# Patient Record
Sex: Female | Born: 1980 | Hispanic: No | Marital: Married | Smoking: Never smoker
Health system: Southern US, Community
[De-identification: ages and names within clinical notes are randomized; demographics above are authoritative.]

## PROBLEM LIST (undated history)

## (undated) DIAGNOSIS — Z789 Other specified health status: Secondary | ICD-10-CM

## (undated) HISTORY — PX: NO PAST SURGERIES: SHX2092

---

## 2006-09-28 ENCOUNTER — Other Ambulatory Visit: Admission: RE | Admit: 2006-09-28 | Discharge: 2006-09-28 | Payer: Self-pay | Admitting: Family Medicine

## 2008-01-14 ENCOUNTER — Other Ambulatory Visit: Admission: RE | Admit: 2008-01-14 | Discharge: 2008-01-14 | Payer: Self-pay | Admitting: Obstetrics and Gynecology

## 2008-08-27 ENCOUNTER — Inpatient Hospital Stay (HOSPITAL_COMMUNITY): Admission: AD | Admit: 2008-08-27 | Discharge: 2008-08-30 | Payer: Self-pay | Admitting: Obstetrics and Gynecology

## 2008-08-28 ENCOUNTER — Encounter (INDEPENDENT_AMBULATORY_CARE_PROVIDER_SITE_OTHER): Payer: Self-pay | Admitting: Obstetrics and Gynecology

## 2009-01-22 ENCOUNTER — Other Ambulatory Visit: Admission: RE | Admit: 2009-01-22 | Discharge: 2009-01-22 | Payer: Self-pay | Admitting: Obstetrics and Gynecology

## 2010-01-24 ENCOUNTER — Other Ambulatory Visit
Admission: RE | Admit: 2010-01-24 | Discharge: 2010-01-24 | Payer: Self-pay | Source: Home / Self Care | Admitting: Obstetrics and Gynecology

## 2010-07-12 LAB — CBC
HCT: 28.9 % — ABNORMAL LOW (ref 36.0–46.0)
HCT: 38.5 % (ref 36.0–46.0)
Hemoglobin: 10.3 g/dL — ABNORMAL LOW (ref 12.0–15.0)
Hemoglobin: 13.4 g/dL (ref 12.0–15.0)
MCHC: 35.6 g/dL (ref 30.0–36.0)
MCV: 88.1 fL (ref 78.0–100.0)
RBC: 3.28 MIL/uL — ABNORMAL LOW (ref 3.87–5.11)
RDW: 12.9 % (ref 11.5–15.5)
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
## 2011-01-25 ENCOUNTER — Other Ambulatory Visit: Payer: Self-pay | Admitting: Obstetrics and Gynecology

## 2011-01-25 ENCOUNTER — Other Ambulatory Visit (HOSPITAL_COMMUNITY)
Admission: RE | Admit: 2011-01-25 | Discharge: 2011-01-25 | Disposition: A | Discharge status: Home / Self Care | Payer: PRIVATE HEALTH INSURANCE | Source: Ambulatory Visit | Attending: Obstetrics and Gynecology | Admitting: Obstetrics and Gynecology

## 2011-01-25 DIAGNOSIS — Z01419 Encounter for gynecological examination (general) (routine) without abnormal findings: Secondary | ICD-10-CM | POA: Insufficient documentation

## 2011-08-21 ENCOUNTER — Inpatient Hospital Stay (HOSPITAL_COMMUNITY): Payer: No Typology Code available for payment source

## 2011-08-21 ENCOUNTER — Inpatient Hospital Stay (HOSPITAL_COMMUNITY)
Admission: AD | Admit: 2011-08-21 | Discharge: 2011-08-21 | Disposition: A | Discharge status: Home / Self Care | Payer: No Typology Code available for payment source | Source: Ambulatory Visit | Attending: Obstetrics and Gynecology | Admitting: Obstetrics and Gynecology

## 2011-08-21 ENCOUNTER — Encounter (HOSPITAL_COMMUNITY): Payer: Self-pay | Admitting: *Deleted

## 2011-08-21 DIAGNOSIS — O99891 Other specified diseases and conditions complicating pregnancy: Secondary | ICD-10-CM | POA: Insufficient documentation

## 2011-08-21 DIAGNOSIS — R109 Unspecified abdominal pain: Secondary | ICD-10-CM | POA: Insufficient documentation

## 2011-08-21 HISTORY — DX: Other specified health status: Z78.9

## 2011-08-21 LAB — CBC
HCT: 36.3 % (ref 36.0–46.0)
Hemoglobin: 12.2 g/dL (ref 12.0–15.0)
MCH: 27.9 pg (ref 26.0–34.0)
MCV: 82.9 fL (ref 78.0–100.0)
RBC: 4.38 MIL/uL (ref 3.87–5.11)

## 2011-08-21 LAB — POCT PREGNANCY, URINE: Preg Test, Ur: POSITIVE — AB
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
## 2011-09-01 LAB — OB RESULTS CONSOLE HIV ANTIBODY (ROUTINE TESTING): HIV: NONREACTIVE

## 2011-09-01 LAB — OB RESULTS CONSOLE RUBELLA ANTIBODY, IGM: Rubella: IMMUNE

## 2011-09-01 LAB — OB RESULTS CONSOLE RPR: RPR: NONREACTIVE

## 2011-09-01 LAB — OB RESULTS CONSOLE HEPATITIS B SURFACE ANTIGEN: Hepatitis B Surface Ag: NEGATIVE

## 2011-09-25 LAB — OB RESULTS CONSOLE GC/CHLAMYDIA: Gonorrhea: NEGATIVE

## 2012-04-21 ENCOUNTER — Encounter (HOSPITAL_COMMUNITY): Payer: Self-pay | Admitting: *Deleted

## 2012-04-21 ENCOUNTER — Inpatient Hospital Stay (HOSPITAL_COMMUNITY): Payer: PRIVATE HEALTH INSURANCE | Admitting: Anesthesiology

## 2012-04-21 ENCOUNTER — Inpatient Hospital Stay (HOSPITAL_COMMUNITY)
Admission: AD | Admit: 2012-04-21 | Discharge: 2012-04-22 | DRG: 775 | Disposition: A | Discharge status: Home / Self Care | Payer: PRIVATE HEALTH INSURANCE | Source: Ambulatory Visit | Attending: Obstetrics and Gynecology | Admitting: Obstetrics and Gynecology

## 2012-04-21 ENCOUNTER — Encounter (HOSPITAL_COMMUNITY): Payer: Self-pay | Admitting: Anesthesiology

## 2012-04-21 LAB — CBC
HCT: 39.6 % (ref 36.0–46.0)
Hemoglobin: 14.1 g/dL (ref 12.0–15.0)
MCH: 29.6 pg (ref 26.0–34.0)
MCHC: 35.6 g/dL (ref 30.0–36.0)
RBC: 4.76 MIL/uL (ref 3.87–5.11)

## 2012-04-21 LAB — TYPE AND SCREEN
ABO/RH(D): B POS
Antibody Screen: NEGATIVE

## 2012-04-21 MED ORDER — LACTATED RINGERS IV SOLN: INTRAVENOUS | Status: DC

## 2012-04-21 MED ORDER — OXYCODONE-ACETAMINOPHEN 5-325 MG PO TABS: 1.0000 | ORAL_TABLET | ORAL | Status: DC | PRN

## 2012-04-21 MED ORDER — WITCH HAZEL-GLYCERIN EX PADS: 1.0000 "application " | MEDICATED_PAD | CUTANEOUS | Status: DC | PRN

## 2012-04-21 MED ORDER — OXYCODONE-ACETAMINOPHEN 5-325 MG PO TABS
1.0000 | ORAL_TABLET | ORAL | Status: DC | PRN
  Administered 2012-04-22: 1 via ORAL
  Filled 2012-04-21: qty 1

## 2012-04-21 MED ORDER — IBUPROFEN 600 MG PO TABS: 600.0000 mg | ORAL_TABLET | Freq: Four times a day (QID) | ORAL | Status: DC | PRN

## 2012-04-21 MED ORDER — TETANUS-DIPHTH-ACELL PERTUSSIS 5-2.5-18.5 LF-MCG/0.5 IM SUSP
0.5000 mL | Freq: Once | INTRAMUSCULAR | Status: AC
  Administered 2012-04-22: 0.5 mL via INTRAMUSCULAR
  Filled 2012-04-21: qty 0.5

## 2012-04-21 MED ORDER — MEASLES, MUMPS & RUBELLA VAC ~~LOC~~ INJ: 0.5000 mL | INJECTION | Freq: Once | SUBCUTANEOUS | Status: DC

## 2012-04-21 MED ORDER — MEDROXYPROGESTERONE ACETATE 150 MG/ML IM SUSP: 150.0000 mg | INTRAMUSCULAR | Status: DC | PRN

## 2012-04-21 MED ORDER — PRENATAL MULTIVITAMIN CH
1.0000 | ORAL_TABLET | Freq: Every day | ORAL | Status: DC
  Administered 2012-04-22: 1 via ORAL
  Filled 2012-04-21: qty 1

## 2012-04-21 MED ORDER — FLEET ENEMA 7-19 GM/118ML RE ENEM: 1.0000 | ENEMA | Freq: Once | RECTAL | Status: DC

## 2012-04-21 MED ORDER — FENTANYL 2.5 MCG/ML BUPIVACAINE 1/10 % EPIDURAL INFUSION (WH - ANES)
14.0000 mL/h | INTRAMUSCULAR | Status: DC
  Filled 2012-04-21: qty 125

## 2012-04-21 MED ORDER — FENTANYL 2.5 MCG/ML BUPIVACAINE 1/10 % EPIDURAL INFUSION (WH - ANES)
INTRAMUSCULAR | Status: DC | PRN
  Administered 2012-04-21: 14 mL/h via EPIDURAL

## 2012-04-21 MED ORDER — LIDOCAINE HCL (PF) 1 % IJ SOLN
30.0000 mL | INTRAMUSCULAR | Status: DC | PRN
  Filled 2012-04-21: qty 30

## 2012-04-21 MED ORDER — EPHEDRINE 5 MG/ML INJ: 10.0000 mg | INTRAVENOUS | Status: DC | PRN

## 2012-04-21 MED ORDER — EPHEDRINE 5 MG/ML INJ
10.0000 mg | INTRAVENOUS | Status: DC | PRN
  Filled 2012-04-21: qty 4

## 2012-04-21 MED ORDER — IBUPROFEN 600 MG PO TABS
600.0000 mg | ORAL_TABLET | Freq: Four times a day (QID) | ORAL | Status: DC
  Administered 2012-04-21 – 2012-04-22 (×4): 600 mg via ORAL
  Filled 2012-04-21 (×4): qty 1

## 2012-04-21 MED ORDER — ONDANSETRON HCL 4 MG/2ML IJ SOLN: 4.0000 mg | Freq: Four times a day (QID) | INTRAMUSCULAR | Status: DC | PRN

## 2012-04-21 MED ORDER — DIPHENHYDRAMINE HCL 50 MG/ML IJ SOLN: 12.5000 mg | INTRAMUSCULAR | Status: DC | PRN

## 2012-04-21 MED ORDER — ZOLPIDEM TARTRATE 5 MG PO TABS: 5.0000 mg | ORAL_TABLET | Freq: Every evening | ORAL | Status: DC | PRN

## 2012-04-21 MED ORDER — LANOLIN HYDROUS EX OINT: TOPICAL_OINTMENT | CUTANEOUS | Status: DC | PRN

## 2012-04-21 MED ORDER — ONDANSETRON HCL 4 MG PO TABS: 4.0000 mg | ORAL_TABLET | ORAL | Status: DC | PRN

## 2012-04-21 MED ORDER — LACTATED RINGERS IV SOLN
500.0000 mL | Freq: Once | INTRAVENOUS | Status: AC
  Administered 2012-04-21: 500 mL via INTRAVENOUS

## 2012-04-21 MED ORDER — DIBUCAINE 1 % RE OINT
1.0000 "application " | TOPICAL_OINTMENT | RECTAL | Status: DC | PRN
  Filled 2012-04-21: qty 28

## 2012-04-21 MED ORDER — DIPHENHYDRAMINE HCL 25 MG PO CAPS: 25.0000 mg | ORAL_CAPSULE | Freq: Four times a day (QID) | ORAL | Status: DC | PRN

## 2012-04-21 MED ORDER — OXYTOCIN 40 UNITS IN LACTATED RINGERS INFUSION - SIMPLE MED
62.5000 mL/h | INTRAVENOUS | Status: DC
  Filled 2012-04-21: qty 1000

## 2012-04-21 MED ORDER — LACTATED RINGERS IV SOLN: 500.0000 mL | INTRAVENOUS | Status: DC | PRN

## 2012-04-21 MED ORDER — BUTORPHANOL TARTRATE 1 MG/ML IJ SOLN: 1.0000 mg | INTRAMUSCULAR | Status: DC | PRN

## 2012-04-21 MED ORDER — PHENYLEPHRINE 40 MCG/ML (10ML) SYRINGE FOR IV PUSH (FOR BLOOD PRESSURE SUPPORT): 80.0000 ug | PREFILLED_SYRINGE | INTRAVENOUS | Status: DC | PRN

## 2012-04-21 MED ORDER — CITRIC ACID-SODIUM CITRATE 334-500 MG/5ML PO SOLN: 30.0000 mL | ORAL | Status: DC | PRN

## 2012-04-21 MED ORDER — OXYTOCIN BOLUS FROM INFUSION
500.0000 mL | INTRAVENOUS | Status: DC
  Administered 2012-04-21: 500 mL via INTRAVENOUS

## 2012-04-21 MED ORDER — SIMETHICONE 80 MG PO CHEW: 80.0000 mg | CHEWABLE_TABLET | ORAL | Status: DC | PRN

## 2012-04-21 MED ORDER — BENZOCAINE-MENTHOL 20-0.5 % EX AERO
1.0000 "application " | INHALATION_SPRAY | CUTANEOUS | Status: DC | PRN
  Filled 2012-04-21: qty 56

## 2012-04-21 MED ORDER — ONDANSETRON HCL 4 MG/2ML IJ SOLN: 4.0000 mg | INTRAMUSCULAR | Status: DC | PRN

## 2012-04-21 MED ORDER — ACETAMINOPHEN 325 MG PO TABS: 650.0000 mg | ORAL_TABLET | ORAL | Status: DC | PRN

## 2012-04-21 MED ORDER — PHENYLEPHRINE 40 MCG/ML (10ML) SYRINGE FOR IV PUSH (FOR BLOOD PRESSURE SUPPORT)
80.0000 ug | PREFILLED_SYRINGE | INTRAVENOUS | Status: DC | PRN
  Filled 2012-04-21: qty 5

## 2012-04-21 MED ORDER — LIDOCAINE HCL (PF) 1 % IJ SOLN
INTRAMUSCULAR | Status: DC | PRN
  Administered 2012-04-21 (×2): 9 mL

## 2012-04-21 MED ORDER — DEXTROSE IN LACTATED RINGERS 5 % IV SOLN: INTRAVENOUS | Status: DC

## 2012-04-21 MED ORDER — SENNOSIDES-DOCUSATE SODIUM 8.6-50 MG PO TABS: 2.0000 | ORAL_TABLET | Freq: Every day | ORAL | Status: DC

## 2012-04-21 NOTE — Progress Notes (Signed)
Delivery Note At 10:57 AM a viable and healthy female was delivered via Vaginal, Spontaneous Delivery (Presentation: ; Occiput Anterior).  APGAR: 9, 9; weight .   Placenta status: Intact, Spontaneous.  Cord: 3 vessels with the following complications: None.    Anesthesia: Epidural  Episiotomy: None Lacerations: None Suture Repair: none Est. Blood Loss (mL): 450  Mom to postpartum.  Baby to nursery-stable.  Leeam Cedrone E 04/21/2012, 11:18 AM

## 2012-04-21 NOTE — Anesthesia Preprocedure Evaluation (Signed)

## 2012-04-21 NOTE — Progress Notes (Signed)
Dr. Neva Seat notified patient arrived with complaints of contractions every 3-5 min and some brown discharge. Contracting every 2-4 on the monitor. Fetal tracing reassuring at this time with moderate variability 10x10 accels, with a few variable decels. SVE 2/60/-3 which is a change from last exam per patient. She was closed on Friday. Will continue to monitor and recheck in 2 hours.

## 2012-04-21 NOTE — MAU Note (Signed)
Contractions every 3-5 mins. Patient noticed some brown discharge.

## 2012-04-21 NOTE — Anesthesia Procedure Notes (Signed)
Epidural Patient location during procedure: OB Start time: 04/21/2012 9:52 AM End time: 04/21/2012 9:56 AM  Staffing Anesthesiologist: Sandrea Hughs Performed by: anesthesiologist   Preanesthetic Checklist Completed: patient identified, site marked, surgical consent, pre-op evaluation, timeout performed, IV checked, risks and benefits discussed and monitors and equipment checked  Epidural Patient position: sitting Prep: site prepped and draped and DuraPrep Patient monitoring: continuous pulse ox and blood pressure Approach: midline Injection technique: LOR air  Needle:  Needle type: Tuohy  Needle gauge: 17 G Needle length: 9 cm and 9 Needle insertion depth: 5 cm cm Catheter type: closed end flexible Catheter size: 19 Gauge Catheter at skin depth: 10 cm Test dose: negative and Other  Assessment Sensory level: T10 Events: blood not aspirated, injection not painful, no injection resistance, negative IV test and no paresthesia  Additional Notes Reason for block:procedure for pain

## 2012-04-21 NOTE — Progress Notes (Signed)
Dr Neva Seat updated on pt's progress, orders received to admit pt.

## 2012-04-21 NOTE — H&P (Signed)
Makayla Foster is a 32 y.o. female presenting for labor at 48 1/7 weeks.  Prenatal course uncomplicated.    OB History    Grav Para Term Preterm Abortions TAB SAB Ect Mult Living   2 1 1       1      Past Medical History  Diagnosis Date  . No pertinent past medical history    Past Surgical History  Procedure Date  . No past surgeries    Family History: family history is negative for Anesthesia problems, and Hypotension, and Malignant hyperthermia, and Pseudochol deficiency, . Social History:  reports that she has never smoked. She does not have any smokeless tobacco history on file. She reports that she does not drink alcohol or use illicit drugs.  ROS Not contributory  Physical exam:  General: well developed, no distress HEENT nl Chest clear Heart S1 and S2 clear Abd BS present Term gravida Ext nl Cx 3.5 cm on admission  Assessment:  Term labor  Plan:  Expectant care   Dilation: 9 Effacement (%): 90 Station: 0 Exam by:: Nikita Surman Blood pressure 89/64, pulse 95, temperature 97.8 F (36.6 C), temperature source Oral, resp. rate 20, height 4' 11.75" (1.518 m), weight 72.394 kg (159 lb 9.6 oz), last menstrual period 06/29/2011, SpO2 100.00%.   Prenatal labs: ABO, Rh: --/--/B POS (05/20 1455) Antibody: Negative (05/31 0000) Rubella:   RPR: Nonreactive (05/31 0000)  HBsAg: Negative (05/31 0000)  HIV: Non-reactive (05/31 0000)  GBS: Negative (12/20 0000)    Lavonn Maxcy E 04/21/2012, 11:10 AM

## 2012-04-22 LAB — CBC
MCH: 29.4 pg (ref 26.0–34.0)
MCV: 86.4 fL (ref 78.0–100.0)
Platelets: 182 10*3/uL (ref 150–400)
RDW: 13.4 % (ref 11.5–15.5)
WBC: 12.9 10*3/uL — ABNORMAL HIGH (ref 4.0–10.5)

## 2012-04-22 MED ORDER — IBUPROFEN 600 MG PO TABS: 600.0000 mg | ORAL_TABLET | Freq: Four times a day (QID) | ORAL | Status: AC | PRN

## 2012-04-22 MED ORDER — PRENATAL MULTIVITAMIN CH: 1.0000 | ORAL_TABLET | Freq: Every day | ORAL | Status: AC

## 2012-04-22 NOTE — Progress Notes (Signed)
Post Partum Day 1 s/p vaginal delivery  Subjective: no complaints, up ad lib, voiding and tolerating PO patient request early discharge home   Objective: Blood pressure 90/56, pulse 81, temperature 98 F (36.7 C), temperature source Oral, resp. rate 18, height 4' 11.75" (1.518 m), weight 72.394 kg (159 lb 9.6 oz), last menstrual period 06/29/2011, SpO2 100.00%, unknown if currently breastfeeding.  Physical Exam:  General: alert and cooperative Lochia: appropriate Uterine Fundus: firm Incision: NA DVT Evaluation: No evidence of DVT seen on physical exam.   Basename 04/22/12 0540 04/21/12 0850  HGB 11.5* 14.1  HCT 33.8* 39.6    Assessment/Plan: Discharge home   LOS: 1 day   Eastin Swing J. 04/22/2012, 10:22 AM

## 2012-04-22 NOTE — Discharge Summary (Signed)
Obstetric Discharge Summary Reason for Admission: onset of labor Prenatal Procedures: none Intrapartum Procedures: spontaneous vaginal delivery Postpartum Procedures: none Complications-Operative and Postpartum: none Hemoglobin  Date Value Range Status  04/22/2012 11.5* 12.0 - 15.0 g/dL Final     DELTA CHECK NOTED     REPEATED TO VERIFY     HCT  Date Value Range Status  04/22/2012 33.8* 36.0 - 46.0 % Final    Physical Exam:  General: alert and cooperative Lochia: appropriate Uterine Fundus: firm Incision: NA DVT Evaluation: No evidence of DVT seen on physical exam.  Discharge Diagnoses: Term Pregnancy-delivered  Discharge Information: Date: 04/22/2012 Activity: pelvic rest Diet: routine Medications: PNV and Ibuprofen Condition: stable Instructions: refer to practice specific booklet Discharge to: home Follow-up Information    Follow up with Jessee Avers., MD. Schedule an appointment as soon as possible for a visit in 6 weeks. (post partum visit )    Contact information:   [address] [place] [address] [phone]          Newborn Data: Live born female  Birth Weight: 5 lb 13 oz (2635 g) APGAR: 9, 9  Home with mother.  Makayla Eddinger J. 04/22/2012, 10:26 AM

## 2012-04-22 NOTE — Anesthesia Postprocedure Evaluation (Signed)
  Anesthesia Post-op Note  Patient: Makayla Foster  Procedure(s) Performed: * No procedures listed *  Patient Location: Mother/Baby  Anesthesia Type:Epidural  Level of Consciousness: awake  Airway and Oxygen Therapy: Patient Spontanous Breathing  Post-op Pain: mild  Post-op Assessment: Patient's Cardiovascular Status Stable and Respiratory Function Stable  Post-op Vital Signs: stable  Complications: No apparent anesthesia complications

## 2012-04-24 ENCOUNTER — Inpatient Hospital Stay (HOSPITAL_COMMUNITY): Admission: RE | Admit: 2012-04-24 | Payer: PRIVATE HEALTH INSURANCE | Source: Ambulatory Visit

## 2012-09-24 ENCOUNTER — Other Ambulatory Visit: Payer: Self-pay | Admitting: Obstetrics and Gynecology

## 2012-09-24 ENCOUNTER — Other Ambulatory Visit (HOSPITAL_COMMUNITY)
Admission: RE | Admit: 2012-09-24 | Discharge: 2012-09-24 | Disposition: A | Discharge status: Home / Self Care | Payer: PRIVATE HEALTH INSURANCE | Source: Ambulatory Visit | Attending: Obstetrics and Gynecology | Admitting: Obstetrics and Gynecology

## 2012-09-24 DIAGNOSIS — Z01419 Encounter for gynecological examination (general) (routine) without abnormal findings: Secondary | ICD-10-CM | POA: Insufficient documentation

## 2012-09-24 DIAGNOSIS — Z1151 Encounter for screening for human papillomavirus (HPV): Secondary | ICD-10-CM | POA: Insufficient documentation

## 2013-10-13 ENCOUNTER — Other Ambulatory Visit (HOSPITAL_COMMUNITY)
Admission: RE | Admit: 2013-10-13 | Discharge: 2013-10-13 | Disposition: A | Discharge status: Home / Self Care | Payer: BC Managed Care – PPO | Source: Ambulatory Visit | Attending: Obstetrics and Gynecology | Admitting: Obstetrics and Gynecology

## 2013-10-13 ENCOUNTER — Other Ambulatory Visit: Payer: Self-pay | Admitting: Obstetrics and Gynecology

## 2013-10-13 DIAGNOSIS — Z01419 Encounter for gynecological examination (general) (routine) without abnormal findings: Secondary | ICD-10-CM | POA: Insufficient documentation

## 2013-10-15 LAB — CYTOLOGY - PAP

## 2014-02-02 ENCOUNTER — Encounter (HOSPITAL_COMMUNITY): Payer: Self-pay | Admitting: *Deleted

## 2014-11-16 ENCOUNTER — Other Ambulatory Visit: Payer: Self-pay | Admitting: Obstetrics and Gynecology

## 2014-11-16 ENCOUNTER — Other Ambulatory Visit (HOSPITAL_COMMUNITY)
Admission: RE | Admit: 2014-11-16 | Discharge: 2014-11-16 | Disposition: A | Discharge status: Home / Self Care | Payer: BLUE CROSS/BLUE SHIELD | Source: Ambulatory Visit | Attending: Obstetrics and Gynecology | Admitting: Obstetrics and Gynecology

## 2014-11-16 DIAGNOSIS — Z01419 Encounter for gynecological examination (general) (routine) without abnormal findings: Secondary | ICD-10-CM | POA: Diagnosis not present

## 2014-11-18 LAB — CYTOLOGY - PAP

## 2019-06-28 ENCOUNTER — Ambulatory Visit: Payer: PRIVATE HEALTH INSURANCE | Attending: Internal Medicine

## 2019-06-28 DIAGNOSIS — Z23 Encounter for immunization: Secondary | ICD-10-CM

## 2019-06-28 NOTE — Progress Notes (Signed)
   Covid-19 Vaccination Clinic  Name:  Makayla Foster    MRN: [id number] DOB: July 01, 1980  06/28/2019  Makayla Foster was observed post Covid-19 immunization for 15 minutes without incident. She was provided with Vaccine Information Sheet and instruction to access the V-Safe system.   Makayla Foster was instructed to call 911 with any severe reactions post vaccine: Marland Kitchen Difficulty breathing  . Swelling of face and throat  . A fast heartbeat  . A bad rash all over body  . Dizziness and weakness   Immunizations Administered    Name Date Dose VIS Date Route   Pfizer COVID-19 Vaccine 06/28/2019  3:46 PM 0.3 mL 03/14/2019 Intramuscular   Manufacturer: [employer], [employer]   Lot: [id number]   NDC: [address]-3

## 2019-07-22 ENCOUNTER — Ambulatory Visit: Payer: PRIVATE HEALTH INSURANCE | Attending: Internal Medicine
